# Patient Record
Sex: Female | Born: 2000 | Race: Black or African American | Hispanic: No | Marital: Single
Health system: Southern US, Community
[De-identification: ages and names within clinical notes are randomized; demographics above are authoritative.]

---

## 2021-05-17 ENCOUNTER — Emergency Department (HOSPITAL_COMMUNITY)
Admission: EM | Admit: 2021-05-17 | Discharge: 2021-05-17 | Disposition: A | Discharge status: Home / Self Care | Payer: No Typology Code available for payment source | Attending: Emergency Medicine | Admitting: Emergency Medicine

## 2021-05-17 ENCOUNTER — Emergency Department (HOSPITAL_COMMUNITY): Payer: No Typology Code available for payment source

## 2021-05-17 ENCOUNTER — Other Ambulatory Visit: Payer: Self-pay

## 2021-05-17 DIAGNOSIS — M25562 Pain in left knee: Secondary | ICD-10-CM | POA: Diagnosis not present

## 2021-05-17 DIAGNOSIS — S8392XA Sprain of unspecified site of left knee, initial encounter: Secondary | ICD-10-CM | POA: Diagnosis not present

## 2021-05-17 DIAGNOSIS — R079 Chest pain, unspecified: Secondary | ICD-10-CM | POA: Insufficient documentation

## 2021-05-17 DIAGNOSIS — S8992XA Unspecified injury of left lower leg, initial encounter: Secondary | ICD-10-CM | POA: Diagnosis present

## 2021-05-17 DIAGNOSIS — Y92410 Unspecified street and highway as the place of occurrence of the external cause: Secondary | ICD-10-CM | POA: Diagnosis not present

## 2021-05-17 MED ORDER — IBUPROFEN 800 MG PO TABS
800.0000 mg | ORAL_TABLET | Freq: Once | ORAL | Status: AC
  Administered 2021-05-17: 800 mg via ORAL
  Filled 2021-05-17: qty 1

## 2021-05-17 MED ORDER — IBUPROFEN 800 MG PO TABS: 800.0000 mg | ORAL_TABLET | Freq: Three times a day (TID) | ORAL | 0 refills | Status: AC
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
## 2021-05-17 NOTE — Progress Notes (Signed)
Orthopedic Tech Progress Note Patient Details:  Julie Stewart March 02, 2001 [id number]  Ortho Devices Type of Ortho Device: Knee Immobilizer, Crutches Ortho Device/Splint Location: lle Ortho Device/Splint Interventions: Ordered, Application, Adjustment   Post Interventions Patient Tolerated: Well Instructions Provided: Care of device, Adjustment of device  Trinna Post 05/17/2021, 3:40 AM
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
